# Patient Record
Sex: Female | Born: 1975 | Hispanic: No | Smoking: Never smoker
Health system: Southern US, Community
[De-identification: ages and names within clinical notes are randomized; demographics above are authoritative.]

## PROBLEM LIST (undated history)

## (undated) DIAGNOSIS — A048 Other specified bacterial intestinal infections: Secondary | ICD-10-CM

## (undated) DIAGNOSIS — K589 Irritable bowel syndrome without diarrhea: Secondary | ICD-10-CM

## (undated) DIAGNOSIS — R51 Headache: Secondary | ICD-10-CM

## (undated) HISTORY — DX: Irritable bowel syndrome, unspecified: K58.9

## (undated) HISTORY — DX: Headache: R51

## (undated) HISTORY — DX: Other specified bacterial intestinal infections: A04.8

---

## 2006-02-14 ENCOUNTER — Other Ambulatory Visit: Admission: RE | Admit: 2006-02-14 | Discharge: 2006-02-14 | Payer: Self-pay | Admitting: Gynecology

## 2006-05-01 ENCOUNTER — Encounter: Admission: RE | Admit: 2006-05-01 | Discharge: 2006-07-30 | Payer: Self-pay | Admitting: Gynecology

## 2012-03-27 ENCOUNTER — Other Ambulatory Visit (INDEPENDENT_AMBULATORY_CARE_PROVIDER_SITE_OTHER): Payer: BC Managed Care – PPO

## 2012-03-27 ENCOUNTER — Encounter: Payer: Self-pay | Admitting: Gastroenterology

## 2012-03-27 ENCOUNTER — Ambulatory Visit (INDEPENDENT_AMBULATORY_CARE_PROVIDER_SITE_OTHER): Payer: BC Managed Care – PPO | Admitting: Gastroenterology

## 2012-03-27 VITALS — BP 94/62 | HR 88 | Ht 64.0 in | Wt 109.6 lb

## 2012-03-27 DIAGNOSIS — R1031 Right lower quadrant pain: Secondary | ICD-10-CM

## 2012-03-27 DIAGNOSIS — R141 Gas pain: Secondary | ICD-10-CM

## 2012-03-27 LAB — BASIC METABOLIC PANEL
BUN: 14 mg/dL (ref 6–23)
Creatinine, Ser: 0.5 mg/dL (ref 0.4–1.2)
GFR: 143.27 mL/min (ref >60.00)
Glucose, Bld: 70 mg/dL (ref 70–99)
Potassium: 4 mEq/L (ref 3.5–5.1)

## 2012-03-27 LAB — HEPATIC FUNCTION PANEL
ALT: 13 U/L (ref 0–35)
AST: 16 U/L (ref 0–37)
Albumin: 3.9 g/dL (ref 3.5–5.2)

## 2012-03-27 LAB — CBC WITH DIFFERENTIAL/PLATELET
Eosinophils Relative: 0.9 % (ref 0.0–5.0)
HCT: 38.3 % (ref 36.0–46.0)
Monocytes Relative: 9.2 % (ref 3.0–12.0)
Neutrophils Relative %: 57.7 % (ref 43.0–77.0)
Platelets: 176 10*3/uL (ref 150.0–400.0)
WBC: 4.6 10*3/uL (ref 4.5–10.5)

## 2012-03-27 LAB — TSH: TSH: 1.09 u[IU]/mL (ref 0.35–5.50)

## 2012-03-27 MED ORDER — HYOSCYAMINE SULFATE 0.125 MG SL SUBL: SUBLINGUAL_TABLET | SUBLINGUAL | Status: DC

## 2012-03-27 NOTE — Patient Instructions (Addendum)
Your physician has requested that you go to the basement for the following lab work before leaving today: Bmet, Hepatic function panel, CBC, TSH. We have sent the following medications to your pharmacy for you to pick up at your convenience: Levsin. Take over the counter Gas-X four times a day as needed for gas and bloating.

## 2012-03-27 NOTE — Progress Notes (Signed)
History of Present Illness: This is a 36 year old female who had a diarrheal illness with a fever when visiting [place] in May. She was treated with an antibiotic in [place] but she does not recall the name. She does not recall having stool cultures obtained. Her diarrhea and fever resolved and since that time she has had problems with recurrent right lower quadrant pain, generalized abdominal cramping, gas and bloating. Her symptoms are more troublesome in the morning and are generally relieved with a bowel movement. She frequently has similar symptoms following high fat meals and citrus fruits. Her bowel habits tend to vary between normal and slightly loose since May. Occasionally she has cramping and rumbling throughout her abdomen. She states she was treated for H. pylori about one year ago. She occasionally has mild reflux symptoms and has used Zantac over-the-counter with good results. Denies weight loss, constipation, change in stool caliber, melena, hematochezia, nausea, vomiting, dysphagia, chest pain.  Review of Systems: Pertinent positive and negative review of systems were noted in the above HPI section. All other review of systems were otherwise negative.  Current Medications, Allergies, Past Medical History, Past Surgical History, Family History and Social History were reviewed in [employer] record.  Physical Exam: General: Well developed , well nourished, no acute distress Head: Normocephalic and atraumatic Eyes:  sclerae anicteric, EOMI Ears: Normal auditory acuity Mouth: No deformity or lesions Neck: Supple, no masses or thyromegaly Lungs: Clear throughout to auscultation Heart: Regular rate and rhythm; no murmurs, rubs or bruits Abdomen: Soft, non tender and non distended. No masses, hepatosplenomegaly or hernias noted. Normal Bowel sounds Rectal: no lesions, scant amount of Hemoccult-negative yellow-brown stool in the rectal vault  Musculoskeletal:  Symmetrical with no gross deformities  Skin: No lesions on visible extremities Pulses:  Normal pulses noted Extremities: No clubbing, cyanosis, edema or deformities noted Neurological: Alert oriented x 4, grossly nonfocal Cervical Nodes:  No significant cervical adenopathy Inguinal Nodes: No significant inguinal adenopathy Psychological:  Alert and cooperative. Normal mood and affect  Assessment and Recommendations:  1. Intermittent right lower quadrant pain, generalized abdominal cramping, gas, bloating and change in bowel habits. 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 Obtain blood work today. Trial of Levsin every 4 hours when necessary and Gas-X 4 times a day when necessary. Avoid high fat foods and citrus fruits. Return office visit for 6 weeks. If her symptoms are not under good control consider further evaluation with colonoscopy.

## 2012-04-05 ENCOUNTER — Telehealth: Payer: Self-pay | Admitting: Gastroenterology

## 2012-04-05 NOTE — Telephone Encounter (Signed)
Left message for patient to call back  

## 2012-04-06 NOTE — Telephone Encounter (Signed)
Patient reports that she is having a non painful "pulse like or jumping feeling in my abdomen".  She said it is rare and does not last long.  She is advised that it sounds like gas.  She will call back if ger symptoms worsen or change.

## 2012-05-01 ENCOUNTER — Ambulatory Visit (INDEPENDENT_AMBULATORY_CARE_PROVIDER_SITE_OTHER): Payer: BC Managed Care – PPO | Admitting: Gastroenterology

## 2012-05-01 ENCOUNTER — Encounter: Payer: Self-pay | Admitting: Gastroenterology

## 2012-05-01 VITALS — BP 100/60 | HR 80 | Ht 64.0 in | Wt 109.0 lb

## 2012-05-01 DIAGNOSIS — R197 Diarrhea, unspecified: Secondary | ICD-10-CM

## 2012-05-01 DIAGNOSIS — R141 Gas pain: Secondary | ICD-10-CM

## 2012-05-01 DIAGNOSIS — R1031 Right lower quadrant pain: Secondary | ICD-10-CM

## 2012-05-01 DIAGNOSIS — R143 Flatulence: Secondary | ICD-10-CM

## 2012-05-01 MED ORDER — HYOSCYAMINE SULFATE 0.125 MG SL SUBL: SUBLINGUAL_TABLET | SUBLINGUAL | Status: AC

## 2012-05-01 NOTE — Progress Notes (Signed)
History of Present Illness: This is a 36 year old female returns for followup of abdominal pain and diarrhea. Her symptoms have substantially improved. She has occasional mild crampy abdominal pain that completely resolves with hyoscyamine. She states she will have 2 or 3 loose stools about one day each week but other than that her bowel habits have returned to normal.   Current Medications, Allergies, Past Medical History, Past Surgical History, Family History and Social History were reviewed in [employer] record.  Physical Exam: General: Well developed , well nourished, no acute distress Head: Normocephalic and atraumatic Eyes:  sclerae anicteric, EOMI Ears: Normal auditory acuity Mouth: No deformity or lesions Lungs: Clear throughout to auscultation Heart: Regular rate and rhythm; no murmurs, rubs or bruits Abdomen: Soft, non tender and non distended. No masses, hepatosplenomegaly or hernias noted. Normal Bowel sounds Musculoskeletal: Symmetrical with no gross deformities  Pulses:  Normal pulses noted Extremities: No clubbing, cyanosis, edema or deformities noted Neurological: Alert oriented x 4, grossly nonfocal Psychological:  Alert and cooperative. Normal mood and affect  Assessment and Recommendations:  1. Probable postinfectious bowel function changes. Continue hyoscyamine when necessary and Imodium daily when necessary. If her symptoms do not completely resolve over the next 4-6 weeks we will plan to proceed with colonoscopy for further evaluation.Marland Kitchen

## 2012-05-01 NOTE — Patient Instructions (Addendum)
We have sent the following medications to your pharmacy for you to pick up at your convenience: Hyoscyamine.  If your diarrhea and abdominal pain do not resolve over one month then call back to schedule your Colonoscopy.

## 2012-05-23 ENCOUNTER — Ambulatory Visit (INDEPENDENT_AMBULATORY_CARE_PROVIDER_SITE_OTHER): Payer: BC Managed Care – PPO | Admitting: Family Medicine

## 2012-05-23 ENCOUNTER — Other Ambulatory Visit: Payer: Self-pay

## 2012-05-23 ENCOUNTER — Encounter: Payer: Self-pay | Admitting: Family Medicine

## 2012-05-23 VITALS — BP 98/74 | Temp 98.3°F | Ht 64.5 in | Wt 113.0 lb

## 2012-05-23 DIAGNOSIS — G44209 Tension-type headache, unspecified, not intractable: Secondary | ICD-10-CM | POA: Insufficient documentation

## 2012-05-23 DIAGNOSIS — Z0000 Encounter for general adult medical examination without abnormal findings: Secondary | ICD-10-CM

## 2012-05-23 DIAGNOSIS — M542 Cervicalgia: Secondary | ICD-10-CM

## 2012-05-23 DIAGNOSIS — Z23 Encounter for immunization: Secondary | ICD-10-CM

## 2012-05-23 LAB — LIPID PANEL
Cholesterol: 176 mg/dL (ref 0–200)
HDL: 68.2 mg/dL (ref >39.00)
VLDL: 11.8 mg/dL (ref 0.0–40.0)

## 2012-05-23 NOTE — Patient Instructions (Addendum)
-  We have ordered labs or studies at this visit. It usually takes 1-2 weeks for results and processing. We will contact you with instructions IF your results are abnormal. Normal results will be released to your [facility] in 1-2 weeks. If you have not heard from [place] or can not find your results in [facility] in 2 weeks please contact our office.   For Neck Pain/Tension Headaches: -tiger balm for muscle tension  -rice heat pillow 2 - 3 times per day -trial of melatonin 1-5mg  daily about 1/2 hour before going to bed -consider yoga class or do daily stretching and strengthening    Follow up in about 1 month for this.   Thank you for enrolling in MyChart. Please follow the instructions below to securely access your online medical record. MyChart allows you to send messages to your doctor, view your test results, renew your prescriptions, schedule appointments, and more.  How Do I Sign Up? 1. In your Internet browser, go to [email or web address] 2. Click on the New  User? link in the Sign In box.  3. Enter your MyChart Access Code exactly as it appears below. You will not need to use this code after you have completed the sign-up process. If you do not sign up before the expiration date, you must request a new code. MyChart Access Code: [email or web address] Expires: 06/22/2012 12:03 PM  4. Enter the last four digits of your Social Security Number (xxxx) and Date of Birth (mm/dd/yyyy) as indicated and click Next. You will be taken to the next sign-up page. 5. Create a MyChart ID. This will be your MyChart login ID and cannot be changed, so think of one that is secure and easy to remember. 6. Create a MyChart password. You can change your password at any time. 7. Enter your Password Reset Question and Answer and click Next. This can be used at a later time if you forget your password.  8. Select your communication preference, and if applicable enter your e-mail address. You will receive  e-mail notification when new information is available in MyChart by choosing to receive e-mail notifications and filling in your e-mail. 9. Click Sign In. You can now view your medical record.   Additional Information If you have questions, you can email [email or web address] or call [phone] to talk to our MyChart staff. Remember, MyChart is NOT to be used for urgent needs. For medical emergencies, dial 911.

## 2012-05-23 NOTE — Progress Notes (Signed)
Chief Complaint  Patient presents with  . Establish Care    HPI:  Here to establish care and to address muscle tension in head and neck.  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 -tension in traps for about 1 month -had been getting headaches 1-2 times per week - tension in neck back of head and around head, no aura -hx of occ headaches in the past -has had massages for the shoulder tension -exercise: 4 x per week - walking and biking -diet: well rounded healthy diet, avoids red meat  -has twin girls 5.5 -denies: vision changes, fevers, chills, weight loss  Hx of colon cancer in mother at age 41 - will need colonoscopy at 1 Last pap in 2011 - normal, always normal Flu vaccine - needs this today Thinks had tdap in 2010   ROS: See pertinent positives and negatives per HPI.  Past Medical History  Diagnosis Date  . H. pylori infection   . Irritable bowel syndrome   . Headache     Family History  Problem Relation Age of Onset  . Colon cancer Mother 71  . Stomach cancer Father   . Heart disease Brother     History   Social History  . Marital Status: Unknown    Spouse Name: N/A    Number of Children: 2  . Years of Education: N/A   Social History Main Topics  . Smoking status: Never Smoker   . Smokeless tobacco: Never Used  . Alcohol Use: 0.6 oz/week    1 Glasses of wine per week     rarely  . Drug Use: No  . Sexually Active: None   Other Topics Concern  . None   Social History Narrative  . None    Current outpatient prescriptions:hyoscyamine (LEVSIN/SL) 0.125 MG SL tablet, 1-2 tablets by mouth or sublingual every four hours as needed, Disp: 100 tablet, Rfl: 11  EXAM:  Filed Vitals:   05/23/12 1128  BP: 98/74  Temp: 98.3 F (36.8 C)    Body mass index is 19.10 kg/(m^2).  GENERAL: vitals reviewed and listed below, alert, oriented, appears well hydrated and in no acute distress,  PERRLA  HEENT: atraumatic, conjucntiva clear, no obvious abnormalities on inspection of external nose and ears  NECK: no masses on inspection, supple  MS: moves all extremities without noticeable abnormality -normal ROM head and neck and shoulders -TTP traps bilat with muscle tension -no bony TTP -normal muscle strength upper ext bilat   PSYCH: pleasant and cooperative, no obvious depression or anxiety  ASSESSMENT AND PLAN:  Discussed the following assessment and plan:    1. Encounter for preventive health examination  Lipid Panel, Hemoglobin A1c  2. Neck pain    3. Tension headache    -discussed level a and b USPSTF recs, flu today -pap due next year -discussed tx options for headaches - hx exam c/w tension headaches - see recs below (discussed risks/benefits)  Orders Placed This Encounter  Procedures  . Lipid Panel  . Hemoglobin A1c    Patient Instructions  -We have ordered labs or studies at this visit. It usually takes 1-2 weeks for results and processing. We will contact you with instructions IF your results are abnormal. Normal results will be released to your [facility] in 1-2 weeks. If you have not heard from [place] or can not find your results in [facility] in 2 weeks please contact our office.  For Neck Pain/Tension Headaches: -tiger balm for muscle tension  -rice heat pillow 2 - 3 times per day -trial of melatonin 1-5mg  daily about 1/2 hour before going to bed -consider yoga class or do daily stretching and strengthening    Follow up in about 1 month for this.   Thank you for enrolling in MyChart. Please follow the instructions below to securely access your online medical record. MyChart allows you to send messages to your doctor, view your test results, renew your prescriptions, schedule appointments, and more.  How Do I Sign Up? 1. In your Internet browser, go to [email or web address] 2. Click on the New  User? link in the Sign In box.   3. Enter your MyChart Access Code exactly as it appears below. You will not need to use this code after you have completed the sign-up process. If you do not sign up before the expiration date, you must request a new code. MyChart Access Code: [email or web address] Expires: 06/22/2012 12:03 PM  4. Enter the last four digits of your Social Security Number (xxxx) and Date of Birth (mm/dd/yyyy) as indicated and click Next. You will be taken to the next sign-up page. 5. Create a MyChart ID. This will be your MyChart login ID and cannot be changed, so think of one that is secure and easy to remember. 6. Create a MyChart password. You can change your password at any time. 7. Enter your Password Reset Question and Answer and click Next. This can be used at a later time if you forget your password.  8. Select your communication preference, and if applicable enter your e-mail address. You will receive e-mail notification when new information is available in MyChart by choosing to receive e-mail notifications and filling in your e-mail. 9. Click Sign In. You can now view your medical record.   Additional Information If you have questions, you can email [email or web address] or call [phone] to talk to our MyChart staff. Remember, MyChart is NOT to be used for urgent needs. For medical emergencies, dial 911.      Return to clinic immediately if symptoms worsen or persist or new concerns.  Return in about 1 month (around 06/23/2012) for tension headaches, neck pain.  Kriste Basque R.

## 2012-05-23 NOTE — Addendum Note (Signed)
Addended by: Azucena Freed on: 05/23/2012 02:37 PM   Modules accepted: Orders

## 2012-09-05 ENCOUNTER — Encounter: Payer: Self-pay | Admitting: Family Medicine

## 2012-09-05 ENCOUNTER — Ambulatory Visit (INDEPENDENT_AMBULATORY_CARE_PROVIDER_SITE_OTHER): Payer: BC Managed Care – PPO | Admitting: Family Medicine

## 2012-09-05 VITALS — BP 94/60 | HR 82 | Temp 98.3°F | Wt 110.0 lb

## 2012-09-05 DIAGNOSIS — F489 Nonpsychotic mental disorder, unspecified: Secondary | ICD-10-CM

## 2012-09-05 DIAGNOSIS — G44209 Tension-type headache, unspecified, not intractable: Secondary | ICD-10-CM

## 2012-09-05 DIAGNOSIS — J309 Allergic rhinitis, unspecified: Secondary | ICD-10-CM

## 2012-09-05 DIAGNOSIS — L989 Disorder of the skin and subcutaneous tissue, unspecified: Secondary | ICD-10-CM

## 2012-09-05 DIAGNOSIS — Z733 Stress, not elsewhere classified: Secondary | ICD-10-CM

## 2012-09-05 DIAGNOSIS — M542 Cervicalgia: Secondary | ICD-10-CM

## 2012-09-05 LAB — CBC WITH DIFFERENTIAL/PLATELET
Eosinophils Absolute: 0 10*3/uL (ref 0.0–0.7)
MCHC: 34 g/dL (ref 30.0–36.0)
MCV: 96.4 fl (ref 78.0–100.0)
Monocytes Absolute: 0.3 10*3/uL (ref 0.1–1.0)
Neutrophils Relative %: 51.9 % (ref 43.0–77.0)
Platelets: 184 10*3/uL (ref 150.0–400.0)
RDW: 13.9 % (ref 11.5–14.6)
WBC: 3.2 10*3/uL — ABNORMAL LOW (ref 4.5–10.5)

## 2012-09-05 LAB — COMPREHENSIVE METABOLIC PANEL
AST: 17 U/L (ref 0–37)
Albumin: 3.8 g/dL (ref 3.5–5.2)
Alkaline Phosphatase: 60 U/L (ref 39–117)
Potassium: 3.9 mEq/L (ref 3.5–5.1)
Sodium: 137 mEq/L (ref 135–145)
Total Protein: 7.4 g/dL (ref 6.0–8.3)

## 2012-09-05 MED ORDER — FLUTICASONE PROPIONATE 50 MCG/ACT NA SUSP: 2.0000 | Freq: Every day | NASAL | Status: AC

## 2012-09-05 MED ORDER — TRIAMCINOLONE ACETONIDE 0.1 % EX CREA: TOPICAL_CREAM | Freq: Two times a day (BID) | CUTANEOUS | Status: AC

## 2012-09-05 NOTE — Patient Instructions (Addendum)
For your Allergies and Headaches: -start flonase daily, 2 sprays each nostril for the first month -zyrtec at night -We placed a referral for you to physical therpay as discussed. It usually takes about 1-2 weeks to process and schedule this referral. If you have not heard from [place] regarding this appointment in 2 weeks please contact our office. -We have ordered labs or studies at this visit. It can take up to 1-2 weeks for results and processing. We will contact you with instructions IF your results are abnormal. Normal results will be released to your [facility]. If you have not heard from [place] or can not find your results in [facility] in 2 weeks please contact our office.  For your skin issue on your leg: -apply the triamcinolone cream twice daily -use a moisturizer daily  Follow up in 1 month

## 2012-09-05 NOTE — Progress Notes (Signed)
Chief Complaint  Patient presents with  . 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 -symptoms: tension in traps, HAs, mild pain - band around head and in back of neck, had been occuring 1-2 x per week last visit -last OV 10/2-13 advised  Heat, topical menthol, yoga or stretching, melatonin -was improved after last visit, then worsened again after trip to [place] for death in family -massage helps, tylenol resolves HAs - uses daily -eats well and gets regular exercise -denies: vision changes, fevers, weight loss, weakness, numbness, radiation of pain to extremities -does have longterm random muscle pains throughout body at times too -does use contact lenses - has had eye exam in last year  AR: -reports saw allergist in the past and has many allergies -not taking any meds currently -does have sneezing, nasal congestion frequently  SKIN LESIONS R LE: -has had for several months -reports they are itchy  Last physical exam:  ROS: See pertinent positives and negatives per HPI.  Past Medical History  Diagnosis Date  . H. pylori infection   . Irritable bowel syndrome   . Headache     Family History  Problem Relation Age of Onset  . Colon cancer Mother 75  . Stomach cancer Father   . Heart disease Brother     History   Social History  . Marital Status: Unknown    Spouse Name: N/A    Number of Children: 2  . Years of Education: N/A   Social History Main Topics  . Smoking status: Never Smoker   . Smokeless tobacco: Never Used  . Alcohol Use: 0.6 oz/week    1 Glasses of wine per week     Comment: rarely  . Drug Use: No  . Sexually Active: None   Other Topics Concern  . None   Social History Narrative  . None    Current outpatient prescriptions:hyoscyamine (LEVSIN/SL) 0.125 MG SL tablet, 1-2 tablets by mouth or sublingual every four hours as needed, Disp: 100 tablet, Rfl: 11;   fluticasone (FLONASE) 50 MCG/ACT nasal spray, Place 2 sprays into the nose daily., Disp: 16 g, Rfl: 6;  triamcinolone cream (KENALOG) 0.1 %, Apply topically 2 (two) times daily., Disp: 30 g, Rfl: 0  EXAM:  Filed Vitals:   09/05/12 0801  BP: 94/60  Pulse: 82  Temp: 98.3 F (36.8 C)    There is no height on file to calculate BMI.  GENERAL: vitals reviewed and listed above, alert, oriented, appears well hydrated and in no acute distress  HEENT: atraumatic, conjunttiva clear, no obvious abnormalities on inspection of external nose and ears. Pale boggy turbinates, clear rhinorrhea, PND with cobblestoning  NECK: no obvious masses on inspection  LUNGS: clear to auscultation bilaterally, no wheezes, rales or rhonchi, good air movement  CV: HRRR, no peripheral edema  MS/NEURO: moves all extremities without noticeable abnormality -PERRLA, CN II-XII grossly intact, normal finger to nose -normal sensation and muscle strength in bilat UE, normal ROM in neck, neg spurling, TTP of trap and post cervical paraspinal muscles -poor posture - shoulder and head forward with tight pecs  SKIN: for patches of scaly erythematous excoriated skin R LE  PSYCH: pleasant and cooperative, no obvious depression or anxiety  ASSESSMENT AND PLAN:  Discussed the following assessment and plan:  1. Tension headache  -offered CT, labs given change in frequency though likely stress related - pt declined  CT -will tx allergies,  tx tension/posture with PT, wean off analgesics, top menthol -discussed meds for tension type HA prophy - may start TCA/refer to neuro if not improved next visit -basic labs and close follow up  2. Tension/Poor posture  - PT, proper work Geologist, engineering, doorway stretches  3. Allergic rhinitis  -uncontrolled, tx with flonase and zyrtec  4. Skin lesion of right leg  -appears eczematous -tx with emmollient and top steroid   -Patient advised to return or notify a doctor immediately if symptoms  worsen or persist or new concerns arise.  Patient Instructions  For your Allergies and Headaches: -start flonase daily, 2 sprays each nostril for the first month -zyrtec at night -We placed a referral for you to physical therpay as discussed. It usually takes about 1-2 weeks to process and schedule this referral. If you have not heard from [place] regarding this appointment in 2 weeks please contact our office. -We have ordered labs or studies at this visit. It can take up to 1-2 weeks for results and processing. We will contact you with instructions IF your results are abnormal. Normal results will be released to your [facility]. If you have not heard from [place] or can not find your results in [facility] in 2 weeks please contact our office.  For your skin issue on your leg: -apply the triamcinolone cream twice daily -use a moisturizer daily  Follow up in 1 month           Ciarrah Rae R.

## 2012-09-06 ENCOUNTER — Telehealth: Payer: Self-pay | Admitting: Family Medicine

## 2012-09-06 DIAGNOSIS — D649 Anemia, unspecified: Secondary | ICD-10-CM

## 2012-09-06 DIAGNOSIS — R17 Unspecified jaundice: Secondary | ICD-10-CM

## 2012-09-06 DIAGNOSIS — D72819 Decreased white blood cell count, unspecified: Secondary | ICD-10-CM

## 2012-09-06 NOTE — Telephone Encounter (Signed)
Let her know:  Her labs showed:  -low blood cells -low vitamin D  -mildly elevated bilirubin  I would like to repeat some of these these the blood tests next week. Please have her schedule a lab appt for this in the next week. For the low vitamin D I would advise 1000 IU of vitamin D3 daily. She can buy this over the counter.

## 2012-09-07 NOTE — Telephone Encounter (Signed)
Called and spoke with pt and pt is aware.  Pt aware of appt on 1/21 for labs only.

## 2012-09-10 ENCOUNTER — Ambulatory Visit: Payer: BC Managed Care – PPO | Admitting: Physical Therapy

## 2012-09-11 ENCOUNTER — Telehealth: Payer: Self-pay | Admitting: Family Medicine

## 2012-09-11 ENCOUNTER — Other Ambulatory Visit: Payer: BC Managed Care – PPO

## 2012-09-11 ENCOUNTER — Ambulatory Visit: Payer: BC Managed Care – PPO | Attending: Family Medicine

## 2012-09-11 DIAGNOSIS — M542 Cervicalgia: Secondary | ICD-10-CM | POA: Insufficient documentation

## 2012-09-11 DIAGNOSIS — R5381 Other malaise: Secondary | ICD-10-CM | POA: Insufficient documentation

## 2012-09-11 DIAGNOSIS — IMO0001 Reserved for inherently not codable concepts without codable children: Secondary | ICD-10-CM | POA: Insufficient documentation

## 2012-09-11 NOTE — Telephone Encounter (Signed)
Error

## 2012-09-12 ENCOUNTER — Ambulatory Visit: Payer: BC Managed Care – PPO

## 2012-09-17 ENCOUNTER — Other Ambulatory Visit (INDEPENDENT_AMBULATORY_CARE_PROVIDER_SITE_OTHER): Payer: BC Managed Care – PPO

## 2012-09-17 ENCOUNTER — Telehealth: Payer: Self-pay | Admitting: Family Medicine

## 2012-09-17 ENCOUNTER — Ambulatory Visit: Payer: BC Managed Care – PPO | Admitting: Physical Therapy

## 2012-09-17 DIAGNOSIS — D649 Anemia, unspecified: Secondary | ICD-10-CM

## 2012-09-17 DIAGNOSIS — R7989 Other specified abnormal findings of blood chemistry: Secondary | ICD-10-CM

## 2012-09-17 DIAGNOSIS — D72819 Decreased white blood cell count, unspecified: Secondary | ICD-10-CM

## 2012-09-17 DIAGNOSIS — R17 Unspecified jaundice: Secondary | ICD-10-CM

## 2012-09-17 LAB — CBC WITH DIFFERENTIAL/PLATELET
Eosinophils Relative: 0.6 % (ref 0.0–5.0)
HCT: 36.3 % (ref 36.0–46.0)
Hemoglobin: 12.4 g/dL (ref 12.0–15.0)
Lymphs Abs: 1.4 10*3/uL (ref 0.7–4.0)
MCV: 96 fl (ref 78.0–100.0)
Monocytes Absolute: 0.4 10*3/uL (ref 0.1–1.0)
Neutro Abs: 2.5 10*3/uL (ref 1.4–7.7)
Platelets: 175 10*3/uL (ref 150.0–400.0)
RDW: 13.8 % (ref 11.5–14.6)
WBC: 4.3 10*3/uL — ABNORMAL LOW (ref 4.5–10.5)

## 2012-09-17 LAB — HEPATIC FUNCTION PANEL
ALT: 12 U/L (ref 0–35)
Total Protein: 7.3 g/dL (ref 6.0–8.3)

## 2012-09-17 NOTE — Addendum Note (Signed)
Addended by: Terressa Koyanagi on: 09/17/2012 01:10 PM   Modules accepted: Orders

## 2012-09-17 NOTE — Telephone Encounter (Addendum)
[phone] (home)   LM for pt to return call. Cell counts improved some - will discuss options when she returns call. Mildly elevated t. Bili.  Pt called back, discussed results and possible implications. Discussed options including further workup/monitoring here versus seeing hematology for evaluation. Pt would like to see hematology. Referral placed.

## 2012-09-19 ENCOUNTER — Ambulatory Visit: Payer: BC Managed Care – PPO

## 2012-09-24 ENCOUNTER — Telehealth: Payer: Self-pay | Admitting: Oncology

## 2012-09-24 ENCOUNTER — Ambulatory Visit: Payer: BC Managed Care – PPO | Attending: Family Medicine | Admitting: Physical Therapy

## 2012-09-24 DIAGNOSIS — IMO0001 Reserved for inherently not codable concepts without codable children: Secondary | ICD-10-CM | POA: Insufficient documentation

## 2012-09-24 DIAGNOSIS — M542 Cervicalgia: Secondary | ICD-10-CM | POA: Insufficient documentation

## 2012-09-24 DIAGNOSIS — R5381 Other malaise: Secondary | ICD-10-CM | POA: Insufficient documentation

## 2012-09-24 NOTE — Telephone Encounter (Signed)
C/D 09/24/12 for appt. 10/09/12

## 2012-09-24 NOTE — Telephone Encounter (Signed)
S/W pt in re NP appt 02/18 @ 1:30 w/Dr. Clelia Croft.  Referring Dr. Selena Batten Dx-Abn CBC, Leukocytopenia, Erythrocytopenia.  Welcome packet mailed

## 2012-09-26 ENCOUNTER — Ambulatory Visit: Payer: BC Managed Care – PPO

## 2012-09-27 ENCOUNTER — Ambulatory Visit (INDEPENDENT_AMBULATORY_CARE_PROVIDER_SITE_OTHER): Payer: BC Managed Care – PPO | Admitting: Family Medicine

## 2012-09-27 ENCOUNTER — Encounter: Payer: Self-pay | Admitting: Family Medicine

## 2012-09-27 VITALS — BP 98/68 | HR 99 | Temp 98.6°F | Wt 115.0 lb

## 2012-09-27 DIAGNOSIS — J309 Allergic rhinitis, unspecified: Secondary | ICD-10-CM

## 2012-09-27 DIAGNOSIS — L309 Dermatitis, unspecified: Secondary | ICD-10-CM

## 2012-09-27 DIAGNOSIS — L259 Unspecified contact dermatitis, unspecified cause: Secondary | ICD-10-CM

## 2012-09-27 DIAGNOSIS — G44209 Tension-type headache, unspecified, not intractable: Secondary | ICD-10-CM

## 2012-09-27 NOTE — Progress Notes (Signed)
Chief Complaint  Patient presents with  . 1 month follow up    neck pain    HPI:  Follow up HAs/tension in neck: -resolved a few weeks ago and has felt great -tried tx allergies, posture ,PT, wean of analgesics, top menthol -offered prophylactic meds/imaging -plan to refer to neuro this appt if not improving -she thinks the flonase and zyrtec is really helping  AR -treated with flonase and zyrtec last visit  Dry skin on legs: -tx with emollient and steroid last visit  Abnormal CBC: -seeing heme/onc next week ROS: See pertinent positives and negatives per HPI.  Past Medical History  Diagnosis Date  . H. pylori infection   . Irritable bowel syndrome   . Headache     Family History  Problem Relation Age of Onset  . Colon cancer Mother 84  . Stomach cancer Father   . Heart disease Brother     History   Social History  . Marital Status: Unknown    Spouse Name: N/A    Number of Children: 2  . Years of Education: N/A   Social History Main Topics  . Smoking status: Never Smoker   . Smokeless tobacco: Never Used  . Alcohol Use: 0.6 oz/week    1 Glasses of wine per week     Comment: rarely  . Drug Use: No  . Sexually Active: None   Other Topics Concern  . None   Social History Narrative  . None    Current outpatient prescriptions:fluticasone (FLONASE) 50 MCG/ACT nasal spray, Place 2 sprays into the nose daily., Disp: 16 g, Rfl: 6;  hyoscyamine (LEVSIN/SL) 0.125 MG SL tablet, 1-2 tablets by mouth or sublingual every four hours as needed, Disp: 100 tablet, Rfl: 11;  triamcinolone cream (KENALOG) 0.1 %, Apply topically 2 (two) times daily., Disp: 30 g, Rfl: 0  EXAM:  Filed Vitals:   09/27/12 1522  BP: 98/68  Pulse: 99  Temp: 98.6 F (37 C)    There is no height on file to calculate BMI.  GENERAL: vitals reviewed and listed above, alert, oriented, appears well hydrated and in no acute distress  HEENT: atraumatic, conjunttiva clear, no obvious  abnormalities on inspection of external nose and ears  NECK: no obvious masses on inspection  LUNGS: clear to auscultation bilaterally, no wheezes, rales or rhonchi, good air movement  CV: HRRR, no peripheral edema  MS: moves all extremities without noticeable abnormality  SKIN: few scars on LEs, dry skin  PSYCH: pleasant and cooperative, no obvious depression or anxiety  ASSESSMENT AND PLAN:  Discussed the following assessment and plan:  1. Tension headache   2. Allergic rhinitis   3. Eczema    -headaches and neck pain have resolved with PT and tx of allergies -skin looks much better - will stop steroid cream -she has appt next week for her abnormal CBC -Patient advised to return or notify a doctor immediately if symptoms worsen or persist or new concerns arise.  Patient Instructions  -continue your flonase and zyrtec  -stop the steroid cream on your legs and use Eucerin or Cerave Cream  -Keep appointment with hematology for your blood counts  -follow up in 4 months     Carrie Mcguire R.

## 2012-09-27 NOTE — Patient Instructions (Addendum)
-  continue your flonase and zyrtec  -stop the steroid cream on your legs and use Eucerin or Cerave Cream  -Keep appointment with hematology for your blood counts  -follow up in 4 months

## 2012-10-01 ENCOUNTER — Ambulatory Visit: Payer: BC Managed Care – PPO | Admitting: Physical Therapy

## 2012-10-01 ENCOUNTER — Telehealth: Payer: Self-pay | Admitting: Family Medicine

## 2012-10-01 DIAGNOSIS — IMO0001 Reserved for inherently not codable concepts without codable children: Secondary | ICD-10-CM

## 2012-10-01 DIAGNOSIS — M2550 Pain in unspecified joint: Secondary | ICD-10-CM

## 2012-10-01 NOTE — Telephone Encounter (Signed)
I thought her neck pain had improved? But, I have placed referral per her request.

## 2012-10-01 NOTE — Telephone Encounter (Signed)
Pt would a referral to rheumatologist  Dr Kellie Simmering [phone] fax [phone]  for body pain/joint pain and neck pain.

## 2012-10-01 NOTE — Telephone Encounter (Signed)
Pls advise.  

## 2012-10-03 ENCOUNTER — Ambulatory Visit: Payer: BC Managed Care – PPO

## 2012-10-04 ENCOUNTER — Other Ambulatory Visit: Payer: Self-pay | Admitting: Oncology

## 2012-10-04 DIAGNOSIS — D72819 Decreased white blood cell count, unspecified: Secondary | ICD-10-CM

## 2012-10-05 ENCOUNTER — Telehealth: Payer: Self-pay | Admitting: Oncology

## 2012-10-05 NOTE — Telephone Encounter (Signed)
PT called to r/s NP appt to 02/26 @ 1:30 w/Dr. Clelia Croft.  Calendar mailed.

## 2012-10-08 ENCOUNTER — Ambulatory Visit: Payer: BC Managed Care – PPO | Admitting: Physical Therapy

## 2012-10-08 ENCOUNTER — Ambulatory Visit: Payer: BC Managed Care – PPO | Admitting: Family Medicine

## 2012-10-09 ENCOUNTER — Ambulatory Visit: Payer: BC Managed Care – PPO | Admitting: Oncology

## 2012-10-09 ENCOUNTER — Ambulatory Visit: Payer: BC Managed Care – PPO

## 2012-10-09 ENCOUNTER — Other Ambulatory Visit: Payer: BC Managed Care – PPO | Admitting: Lab

## 2012-10-10 ENCOUNTER — Telehealth: Payer: Self-pay | Admitting: Oncology

## 2012-10-10 ENCOUNTER — Ambulatory Visit: Payer: BC Managed Care – PPO

## 2012-10-10 NOTE — Telephone Encounter (Signed)
S/W pt in re NP appt r/s to 03/05 @ 1:30.  Calendar mailed.

## 2012-10-15 ENCOUNTER — Ambulatory Visit: Payer: BC Managed Care – PPO | Admitting: Physical Therapy

## 2012-10-17 ENCOUNTER — Ambulatory Visit: Payer: BC Managed Care – PPO

## 2012-10-17 ENCOUNTER — Other Ambulatory Visit: Payer: BC Managed Care – PPO | Admitting: Lab

## 2012-10-17 ENCOUNTER — Ambulatory Visit: Payer: BC Managed Care – PPO | Admitting: Oncology

## 2012-10-18 ENCOUNTER — Ambulatory Visit: Payer: BC Managed Care – PPO | Admitting: Physical Therapy

## 2012-10-22 ENCOUNTER — Ambulatory Visit
Admission: RE | Admit: 2012-10-22 | Discharge: 2012-10-22 | Disposition: A | Discharge status: Home / Self Care | Payer: BC Managed Care – PPO | Source: Ambulatory Visit | Attending: Rheumatology | Admitting: Rheumatology

## 2012-10-22 ENCOUNTER — Telehealth: Payer: Self-pay | Admitting: *Deleted

## 2012-10-22 ENCOUNTER — Other Ambulatory Visit: Payer: Self-pay | Admitting: Rheumatology

## 2012-10-22 DIAGNOSIS — M542 Cervicalgia: Secondary | ICD-10-CM

## 2012-10-22 NOTE — Telephone Encounter (Signed)
Per patient voicemail, I have forwarded the message to Tiffany in [facility].  JMW

## 2012-10-23 ENCOUNTER — Ambulatory Visit: Payer: BC Managed Care – PPO | Attending: Family Medicine | Admitting: Physical Therapy

## 2012-10-23 DIAGNOSIS — R5381 Other malaise: Secondary | ICD-10-CM | POA: Insufficient documentation

## 2012-10-23 DIAGNOSIS — IMO0001 Reserved for inherently not codable concepts without codable children: Secondary | ICD-10-CM | POA: Insufficient documentation

## 2012-10-23 DIAGNOSIS — M542 Cervicalgia: Secondary | ICD-10-CM | POA: Insufficient documentation

## 2012-10-23 DIAGNOSIS — R51 Headache: Secondary | ICD-10-CM | POA: Insufficient documentation

## 2012-10-24 ENCOUNTER — Other Ambulatory Visit: Payer: BC Managed Care – PPO

## 2012-10-24 ENCOUNTER — Telehealth: Payer: Self-pay | Admitting: Oncology

## 2012-10-24 ENCOUNTER — Ambulatory Visit: Payer: BC Managed Care – PPO | Admitting: Oncology

## 2012-10-24 ENCOUNTER — Ambulatory Visit: Payer: BC Managed Care – PPO

## 2012-10-24 NOTE — Telephone Encounter (Signed)
R/s np appt for 11/21/12@1 :30. Pt is aware

## 2012-10-25 ENCOUNTER — Ambulatory Visit: Payer: BC Managed Care – PPO | Admitting: Physical Therapy

## 2012-10-30 ENCOUNTER — Ambulatory Visit: Payer: BC Managed Care – PPO | Admitting: Physical Therapy

## 2012-11-21 ENCOUNTER — Ambulatory Visit: Payer: BC Managed Care – PPO | Admitting: Oncology

## 2012-11-21 ENCOUNTER — Other Ambulatory Visit: Payer: BC Managed Care – PPO | Admitting: Lab

## 2012-11-21 ENCOUNTER — Ambulatory Visit: Payer: BC Managed Care – PPO

## 2012-12-03 ENCOUNTER — Telehealth: Payer: Self-pay | Admitting: Oncology

## 2012-12-03 NOTE — Telephone Encounter (Signed)
LVOM FOR PT TO RETURN CALLL IN RE TO APPT.

## 2012-12-11 ENCOUNTER — Other Ambulatory Visit: Payer: BC Managed Care – PPO | Admitting: Lab

## 2012-12-11 ENCOUNTER — Ambulatory Visit: Payer: BC Managed Care – PPO | Admitting: Oncology

## 2012-12-11 ENCOUNTER — Ambulatory Visit: Payer: BC Managed Care – PPO

## 2012-12-26 ENCOUNTER — Other Ambulatory Visit: Payer: Self-pay | Admitting: Family Medicine

## 2012-12-26 DIAGNOSIS — M542 Cervicalgia: Secondary | ICD-10-CM

## 2012-12-26 DIAGNOSIS — M25519 Pain in unspecified shoulder: Secondary | ICD-10-CM

## 2013-01-11 ENCOUNTER — Other Ambulatory Visit: Payer: BC Managed Care – PPO

## 2013-01-15 ENCOUNTER — Ambulatory Visit
Admission: RE | Admit: 2013-01-15 | Discharge: 2013-01-15 | Disposition: A | Discharge status: Home / Self Care | Payer: BC Managed Care – PPO | Source: Ambulatory Visit | Attending: Family Medicine | Admitting: Family Medicine

## 2013-01-15 DIAGNOSIS — M25519 Pain in unspecified shoulder: Secondary | ICD-10-CM

## 2013-01-15 DIAGNOSIS — M542 Cervicalgia: Secondary | ICD-10-CM

## 2013-12-31 ENCOUNTER — Other Ambulatory Visit (HOSPITAL_COMMUNITY)
Admission: RE | Admit: 2013-12-31 | Discharge: 2013-12-31 | Disposition: A | Discharge status: Home / Self Care | Payer: BC Managed Care – PPO | Source: Ambulatory Visit | Attending: Obstetrics & Gynecology | Admitting: Obstetrics & Gynecology

## 2013-12-31 ENCOUNTER — Other Ambulatory Visit: Payer: Self-pay | Admitting: Obstetrics and Gynecology

## 2013-12-31 DIAGNOSIS — Z1151 Encounter for screening for human papillomavirus (HPV): Secondary | ICD-10-CM | POA: Insufficient documentation

## 2013-12-31 DIAGNOSIS — Z01419 Encounter for gynecological examination (general) (routine) without abnormal findings: Secondary | ICD-10-CM | POA: Insufficient documentation

## 2014-01-22 IMAGING — CR DG CERVICAL SPINE COMPLETE 4+V
5 series · 5 of 5 positions shown · non-contrast
Comparison: None.

CLINICAL DATA: Left arm and neck pain for several months, no injury

CERVICAL SPINE - COMPLETE 4+ VIEW

[view not recorded (1 of 5)]
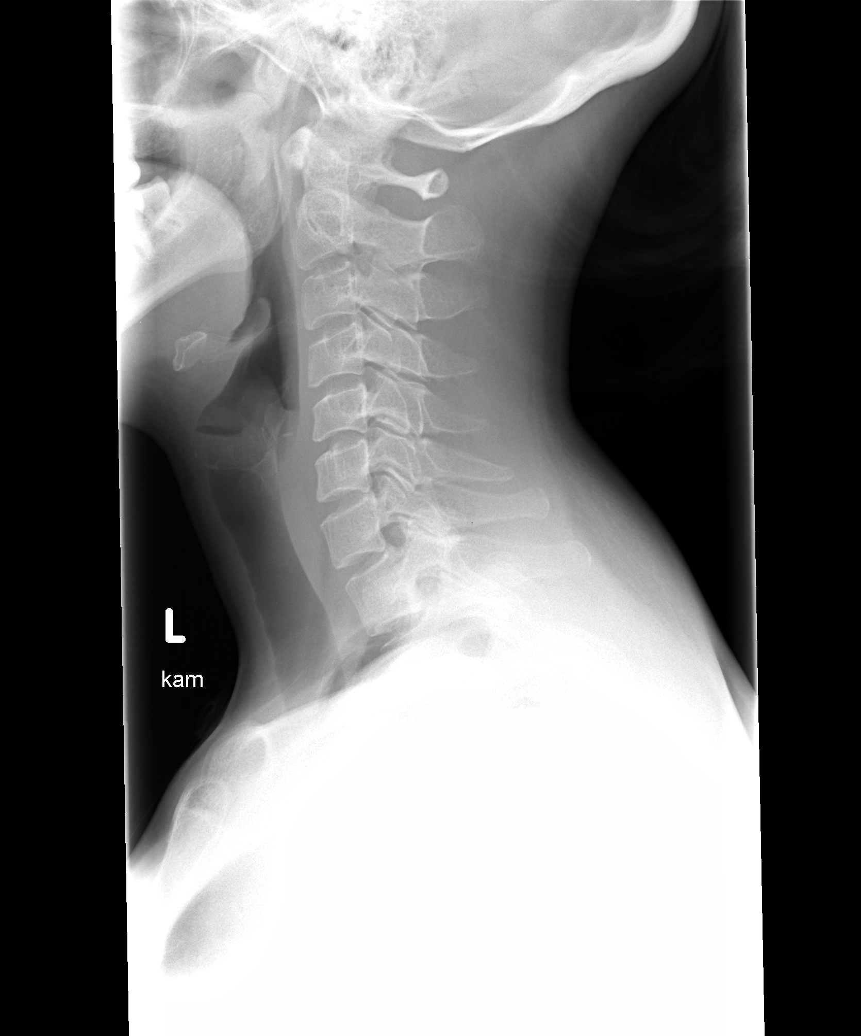

[view not recorded (2 of 5)]
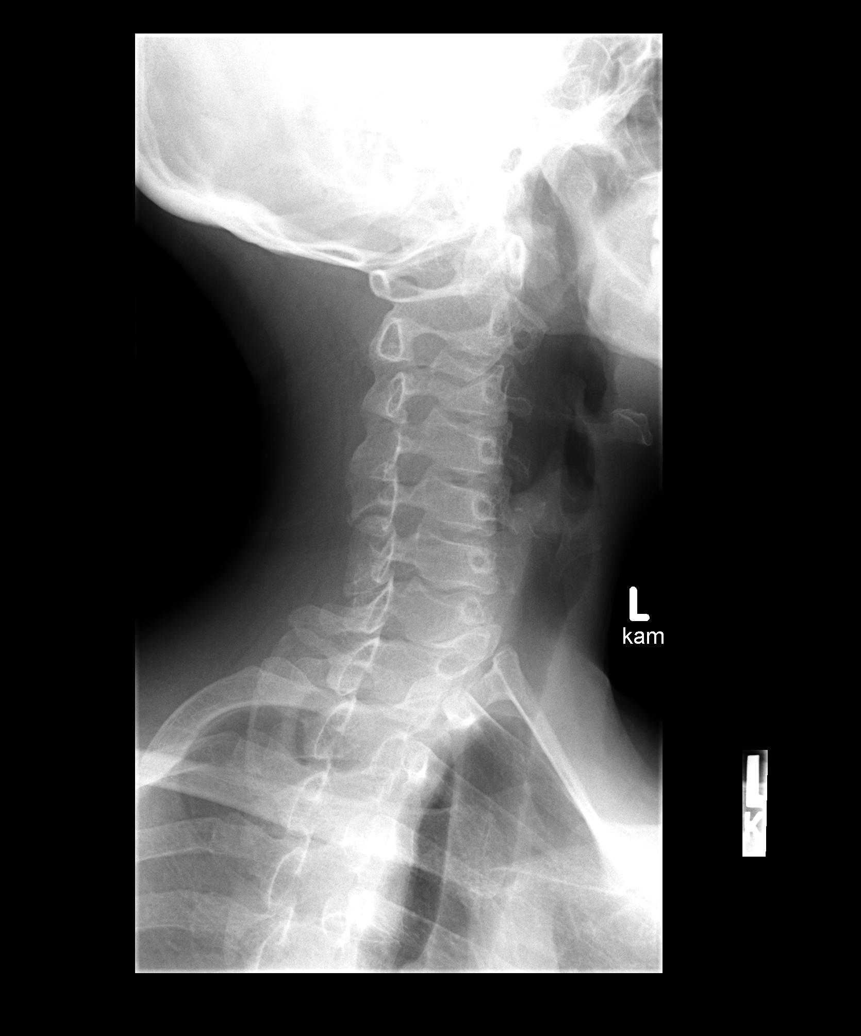

[view not recorded (3 of 5)]
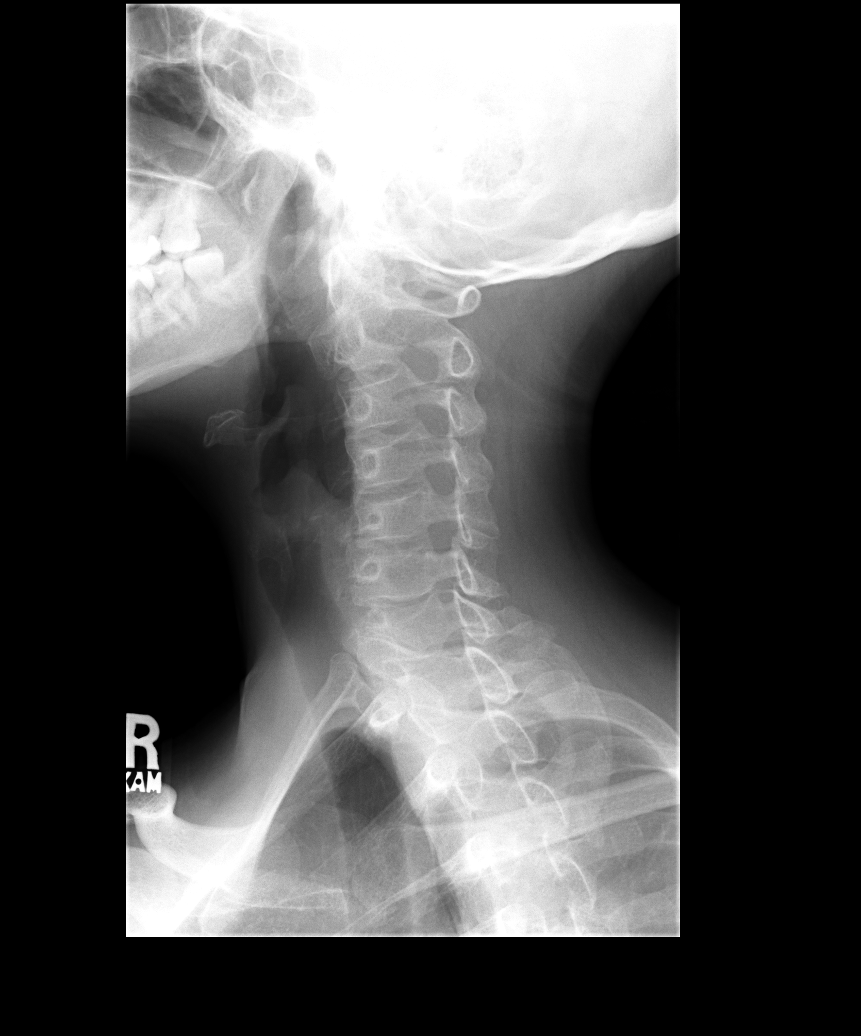

[view not recorded (4 of 5)]
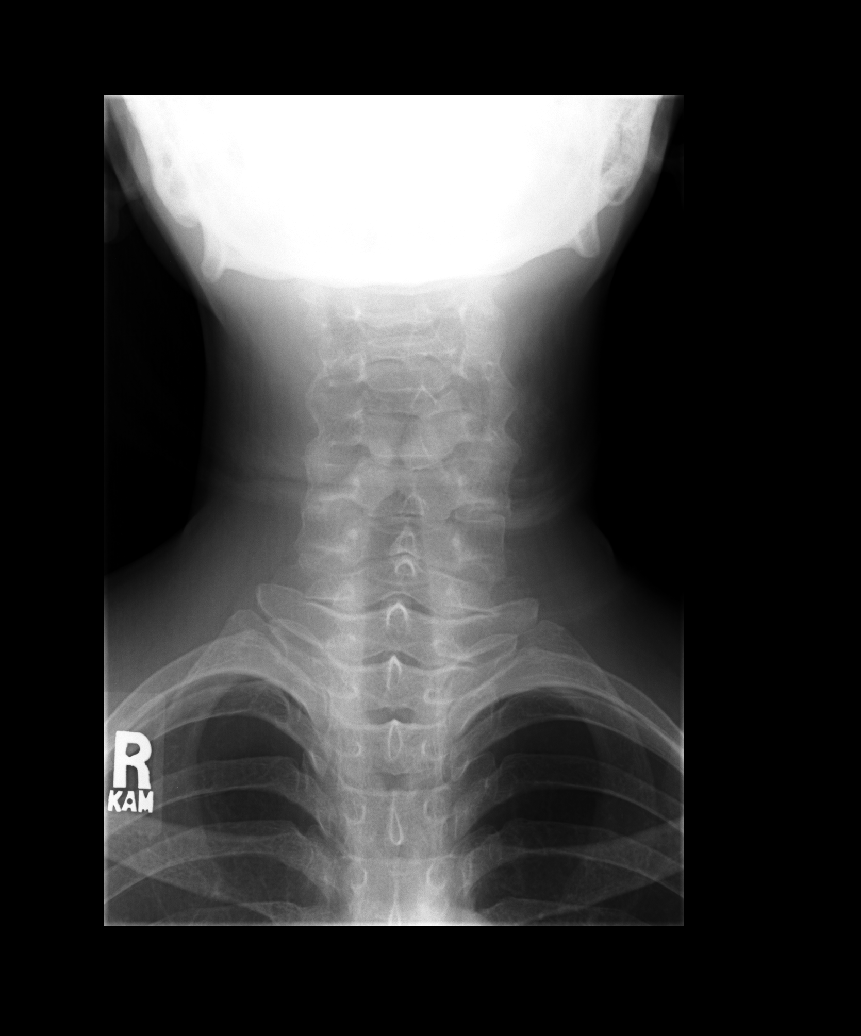

[view not recorded (5 of 5)]
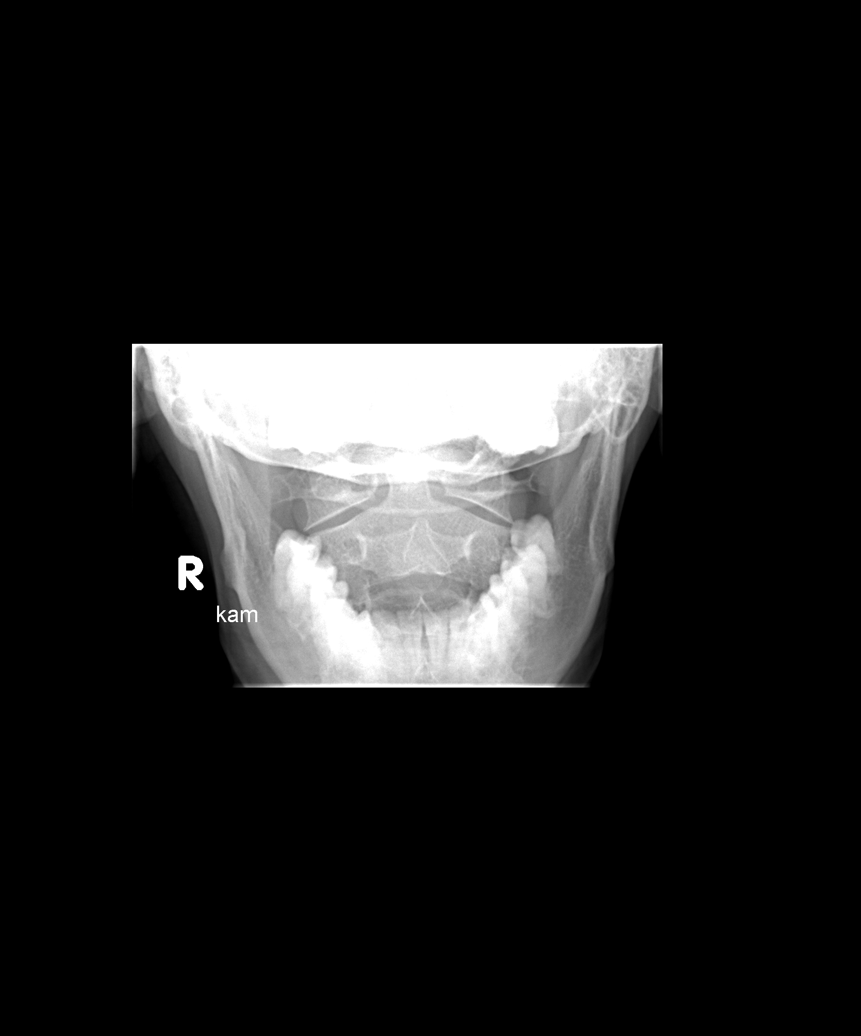

[5 of 5 positions shown; findings below may reference images not displayed]

FINDINGS: The cervical vertebrae are in normal alignment.
Intervertebral disc spaces appear normal.  No prevertebral soft
tissue swelling is seen.  On oblique views the foramina are patent.
The odontoid process is intact.  The lung apices are clear.
IMPRESSION: Normal alignment.  Normal intervertebral disc spaces.

## 2014-01-28 ENCOUNTER — Ambulatory Visit: Payer: BC Managed Care – PPO | Admitting: Gastroenterology

## 2014-02-05 ENCOUNTER — Telehealth: Payer: Self-pay | Admitting: Gastroenterology

## 2014-02-05 NOTE — Telephone Encounter (Signed)
I have attempted to return the call the listed number has been disconnected the other number has no machine and states "the person is not able to be reached"

## 2014-02-10 ENCOUNTER — Ambulatory Visit: Payer: BC Managed Care – PPO | Admitting: Gastroenterology

## 2014-02-10 NOTE — Telephone Encounter (Signed)
I have attempted again to reach the patient the listed number is disconnected and home number - the person that answered states it is not a valid number

## 2014-03-03 ENCOUNTER — Ambulatory Visit: Payer: BC Managed Care – PPO | Admitting: Gastroenterology
